# Patient Record
Sex: Male | Born: 1985 | Race: White | Hispanic: No | Marital: Married | Smoking: Current every day smoker
Health system: Southern US, Community
[De-identification: ages and names within clinical notes are randomized; demographics above are authoritative.]

---
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
## 2008-01-29 ENCOUNTER — Emergency Department (HOSPITAL_COMMUNITY): Admission: EM | Admit: 2008-01-29 | Discharge: 2008-01-30 | Payer: Self-pay | Admitting: Emergency Medicine

## 2010-07-14 ENCOUNTER — Emergency Department: Payer: Self-pay | Admitting: Emergency Medicine

## 2011-01-08 ENCOUNTER — Emergency Department: Payer: Self-pay | Admitting: Emergency Medicine

## 2011-01-23 LAB — POCT I-STAT, CHEM 8
BUN: 15
Calcium, Ion: 0.96 — ABNORMAL LOW
Chloride: 100
Creatinine, Ser: 1.3
Glucose, Bld: 85
HCT: 52
Hemoglobin: 17.7 — ABNORMAL HIGH
Potassium: 4.2
Sodium: 132 — ABNORMAL LOW
TCO2: 22

## 2011-11-19 ENCOUNTER — Emergency Department: Payer: Self-pay | Admitting: Emergency Medicine

## 2011-11-19 LAB — COMPREHENSIVE METABOLIC PANEL
Albumin: 4.5 g/dL (ref 3.4–5.0)
Alkaline Phosphatase: 83 U/L (ref 50–136)
Anion Gap: 7 (ref 7–16)
BUN: 15 mg/dL (ref 7–18)
Chloride: 104 mmol/L (ref 98–107)
Co2: 28 mmol/L (ref 21–32)
Creatinine: 1.05 mg/dL (ref 0.60–1.30)
Osmolality: 278 (ref 275–301)
SGPT (ALT): 33 U/L
Sodium: 139 mmol/L (ref 136–145)
Total Protein: 8.2 g/dL (ref 6.4–8.2)

## 2011-11-19 LAB — URINALYSIS, COMPLETE
Blood: NEGATIVE
Glucose,UR: NEGATIVE mg/dL (ref 0–75)
Leukocyte Esterase: NEGATIVE
Nitrite: NEGATIVE
Ph: 6 (ref 4.5–8.0)
Protein: NEGATIVE
RBC,UR: <1 /HPF (ref 0–5)
Squamous Epithelial: NONE SEEN

## 2011-11-19 LAB — CBC
HCT: 46.7 % (ref 40.0–52.0)
HGB: 16.3 g/dL (ref 13.0–18.0)
MCH: 32.2 pg (ref 26.0–34.0)
MCHC: 35 g/dL (ref 32.0–36.0)
MCV: 92 fL (ref 80–100)
RBC: 5.08 10*6/uL (ref 4.40–5.90)
RDW: 13 % (ref 11.5–14.5)

## 2011-11-23 ENCOUNTER — Emergency Department: Payer: Self-pay | Admitting: Emergency Medicine

## 2012-09-11 ENCOUNTER — Emergency Department: Payer: Self-pay | Admitting: Emergency Medicine

## 2012-09-11 LAB — URINALYSIS, COMPLETE
Bacteria: NONE SEEN
Glucose,UR: NEGATIVE mg/dL (ref 0–75)
Ketone: NEGATIVE
Nitrite: NEGATIVE
Ph: 6 (ref 4.5–8.0)
Specific Gravity: 1.023 (ref 1.003–1.030)
Squamous Epithelial: NONE SEEN
WBC UR: 37 /HPF (ref 0–5)

## 2012-09-13 LAB — URINE CULTURE

## 2013-03-04 ENCOUNTER — Encounter (HOSPITAL_COMMUNITY): Payer: Self-pay | Admitting: Emergency Medicine

## 2013-03-04 ENCOUNTER — Emergency Department (HOSPITAL_COMMUNITY)
Admission: EM | Admit: 2013-03-04 | Discharge: 2013-03-04 | Disposition: A | Discharge status: Home / Self Care | Payer: Self-pay | Attending: Emergency Medicine | Admitting: Emergency Medicine

## 2013-03-04 DIAGNOSIS — F17200 Nicotine dependence, unspecified, uncomplicated: Secondary | ICD-10-CM | POA: Insufficient documentation

## 2013-03-04 DIAGNOSIS — K0889 Other specified disorders of teeth and supporting structures: Secondary | ICD-10-CM

## 2013-03-04 DIAGNOSIS — K029 Dental caries, unspecified: Secondary | ICD-10-CM | POA: Insufficient documentation

## 2013-03-04 DIAGNOSIS — K089 Disorder of teeth and supporting structures, unspecified: Secondary | ICD-10-CM | POA: Insufficient documentation

## 2013-03-04 MED ORDER — OXYCODONE-ACETAMINOPHEN 5-325 MG PO TABS
2.0000 | ORAL_TABLET | Freq: Once | ORAL | Status: AC
  Administered 2013-03-04: 2 via ORAL
  Filled 2013-03-04: qty 2

## 2013-03-04 MED ORDER — OXYCODONE-ACETAMINOPHEN 5-325 MG PO TABS: 1.0000 | ORAL_TABLET | Freq: Four times a day (QID) | ORAL | Status: DC | PRN

## 2013-03-04 MED ORDER — PENICILLIN V POTASSIUM 500 MG PO TABS: 500.0000 mg | ORAL_TABLET | Freq: Four times a day (QID) | ORAL | Status: DC

## 2013-03-04 NOTE — ED Notes (Signed)
This is a 27 yo comes in with dental mouth pain for the last 2 days took 100mg  po tramadol 6hrs ago withtout relief
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
## 2013-03-04 NOTE — ED Provider Notes (Signed)
CSN: [id number]     Arrival date & time 03/04/13  1478 History   First MD Initiated Contact with Patient 03/04/13 [phone]     Chief Complaint  Patient presents with  . Dental Pain   (Consider location/radiation/quality/duration/timing/severity/associated sxs/prior Treatment) Patient is a 27 y.o. male presenting with tooth pain. The history is provided by the patient. No language interpreter was used.  Dental Pain Location:  Upper and lower Upper teeth location:  2/RU 2nd molar and 3/RU 1st molar Lower teeth location:  30/RL 1st molar and 31/RL 2nd molar Quality:  Sharp and throbbing Severity:  Moderate Onset quality:  Gradual Duration:  2 days Timing:  Constant Progression:  Worsening Chronicity:  Recurrent Context: dental caries and poor dentition   Context: not abscess, cap still on and not trauma   Relieved by: Tramadol. Worsened by:  Touching and pressure Associated symptoms: no difficulty swallowing, no drooling, no fever, no gum swelling, no neck pain, no neck swelling, no oral bleeding, no oral lesions and no trismus   Risk factors: lack of dental care and smoking     History reviewed. No pertinent past medical history. History reviewed. No pertinent past surgical history. No family history on file. History  Substance Use Topics  . Smoking status: Current Every Day Smoker  . Smokeless tobacco: Not on file  . Alcohol Use: Yes    Review of Systems  Constitutional: Negative for fever.  HENT: Positive for dental problem. Negative for drooling, mouth sores and trouble swallowing.   Respiratory: Negative for shortness of breath.   Gastrointestinal: Negative for nausea and vomiting.  Musculoskeletal: Negative for neck pain and neck stiffness.  All other systems reviewed and are negative.    Allergies  Review of patient's allergies indicates no known allergies.  Home Medications   Current Outpatient Rx  Name  Route  Sig  Dispense  Refill  . traMADol (ULTRAM) 50 MG  tablet   Oral   Take 50 mg by mouth every 6 (six) hours as needed.         Marland Kitchen oxyCODONE-acetaminophen (PERCOCET/ROXICET) 5-325 MG per tablet   Oral   Take 1 tablet by mouth every 6 (six) hours as needed for severe pain.   5 tablet   0   . penicillin v potassium (VEETID) 500 MG tablet   Oral   Take 1 tablet (500 mg total) by mouth 4 (four) times daily.   40 tablet   0    BP 126/73  Pulse 72  Temp(Src) 99.7 F (37.6 C) (Oral)  Resp 18  Ht 5\' 8"  (1.727 m)  Wt 185 lb (83.915 kg)  BMI 28.14 kg/m2  SpO2 100%  Physical Exam  Nursing note and vitals reviewed. Constitutional: He is oriented to person, place, and time. He appears well-developed and well-nourished. No distress.  HENT:  Head: Normocephalic and atraumatic.  Right Ear: Tympanic membrane, external ear and ear canal normal. No mastoid tenderness.  Left Ear: Tympanic membrane, external ear and ear canal normal. No mastoid tenderness.  Mouth/Throat: Uvula is midline, oropharynx is clear and moist and mucous membranes are normal. No trismus in the jaw. Abnormal dentition. Dental caries present. No dental abscesses or uvula swelling. No oropharyngeal exudate or tonsillar abscesses.    No area of fluctuance to suspect drainable dental abscess. Uvula midline and airway patent. Patient tolerating secretions without difficulty. No trismus or stridor. No facial swelling.  Eyes: Conjunctivae and EOM are normal. Pupils are equal, round, and reactive to light.  No scleral icterus.  Neck: Normal range of motion. Neck supple.  Pulmonary/Chest: Effort normal. No stridor. No respiratory distress.  Musculoskeletal: Normal range of motion.  Neurological: He is alert and oriented to person, place, and time.  Skin: Skin is warm and dry. No rash noted. He is not diaphoretic. No erythema. No pallor.  Psychiatric: He has a normal mood and affect. His behavior is normal.    ED Course  Procedures (including critical care time) Labs  Review Labs Reviewed - No data to display Imaging Review No results found.  EKG Interpretation   None       MDM   1. Pain, dental    Uncomplicated dental pain. Patient well and nontoxic appearing, hemodynamically stable, and afebrile. Patient tolerating secretions without difficulty. No trismus or stridor noted. Airway patent and uvula midline. No facial swelling, red flags, or signs concerning for Ludwig's angina. Patient treated in the ED with Percocet. He is stable for discharge with dental followup. Prescription for Penicillin and Percocet prescribed for symptoms. Return precautions discussed and patient agreeable to plan with no unaddressed concerns.    Antony Madura, [place] 03/04/13 [phone]

## 2013-03-11 NOTE — ED Provider Notes (Signed)
Medical screening examination/treatment/procedure(s) were performed by non-physician practitioner and as supervising physician I was immediately available for consultation/collaboration.  EKG Interpretation   None        Shaniya Tashiro, MD 03/11/13 0757 

## 2013-03-17 ENCOUNTER — Ambulatory Visit: Payer: Self-pay

## 2013-03-17 ENCOUNTER — Ambulatory Visit: Payer: Self-pay | Admitting: Internal Medicine

## 2013-07-12 ENCOUNTER — Emergency Department: Payer: Self-pay | Admitting: Internal Medicine

## 2014-02-26 ENCOUNTER — Emergency Department (HOSPITAL_COMMUNITY)
Admission: EM | Admit: 2014-02-26 | Discharge: 2014-02-26 | Disposition: A | Discharge status: Home / Self Care | Payer: Self-pay | Attending: Emergency Medicine | Admitting: Emergency Medicine

## 2014-02-26 ENCOUNTER — Encounter (HOSPITAL_COMMUNITY): Payer: Self-pay | Admitting: Emergency Medicine

## 2014-02-26 DIAGNOSIS — Z792 Long term (current) use of antibiotics: Secondary | ICD-10-CM | POA: Insufficient documentation

## 2014-02-26 DIAGNOSIS — Z4802 Encounter for removal of sutures: Secondary | ICD-10-CM | POA: Insufficient documentation

## 2014-02-26 DIAGNOSIS — Z720 Tobacco use: Secondary | ICD-10-CM | POA: Insufficient documentation
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
## 2014-02-26 NOTE — Discharge Instructions (Signed)

## 2014-02-26 NOTE — ED Notes (Signed)
Pt here for staple removal. Staples in place on top of head, no signs of redness or infection. Pt denies any issues with staples.

## 2014-06-03 ENCOUNTER — Encounter (HOSPITAL_COMMUNITY): Payer: Self-pay | Admitting: Emergency Medicine

## 2014-06-03 ENCOUNTER — Emergency Department (HOSPITAL_COMMUNITY)
Admission: EM | Admit: 2014-06-03 | Discharge: 2014-06-03 | Disposition: A | Discharge status: Home / Self Care | Payer: Self-pay | Attending: Emergency Medicine | Admitting: Emergency Medicine

## 2014-06-03 ENCOUNTER — Emergency Department (HOSPITAL_COMMUNITY): Payer: Self-pay

## 2014-06-03 DIAGNOSIS — Z79899 Other long term (current) drug therapy: Secondary | ICD-10-CM | POA: Insufficient documentation

## 2014-06-03 DIAGNOSIS — Z720 Tobacco use: Secondary | ICD-10-CM | POA: Insufficient documentation

## 2014-06-03 DIAGNOSIS — R05 Cough: Secondary | ICD-10-CM | POA: Insufficient documentation

## 2014-06-03 DIAGNOSIS — R059 Cough, unspecified: Secondary | ICD-10-CM

## 2014-06-03 DIAGNOSIS — R0981 Nasal congestion: Secondary | ICD-10-CM

## 2014-06-03 DIAGNOSIS — J3489 Other specified disorders of nose and nasal sinuses: Secondary | ICD-10-CM | POA: Insufficient documentation

## 2014-06-03 MED ORDER — BENZONATATE 100 MG PO CAPS: 100.0000 mg | ORAL_CAPSULE | Freq: Three times a day (TID) | ORAL | Status: DC

## 2014-06-03 MED ORDER — OXYMETAZOLINE HCL 0.05 % NA SOLN: 1.0000 | Freq: Two times a day (BID) | NASAL | Status: AC

## 2014-06-03 MED ORDER — MOMETASONE FUROATE 50 MCG/ACT NA SUSP: 2.0000 | Freq: Every day | NASAL | Status: DC

## 2014-06-03 NOTE — ED Notes (Signed)
Pt states he has been feeling bad for about 3 days   Pt states he has nasal congestion and sinus pressure  Pt c/o nonproductive cough

## 2014-06-03 NOTE — ED Notes (Signed)
Pt reports nasal congestion x 3 days. 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 NAD noted.

## 2014-06-03 NOTE — Discharge Instructions (Signed)

## 2014-06-03 NOTE — ED Provider Notes (Signed)
CSN: [id number]     Arrival date & time 06/03/14  0044 History   First MD Initiated Contact with Patient 06/03/14 0111     Chief Complaint  Patient presents with  . Nasal Congestion     (Consider location/radiation/quality/duration/timing/severity/associated sxs/prior Treatment) HPI Patient presents with 3 days of nasal congestion and sinus pressure. He also complains of nonproductive cough. Denies any fever or chills. No sore throat. Denies chest pain or difficulty breathing. Patient states he's tried over-the-counter medication without relief. Symptoms are exacerbated by cold weather. History reviewed. No pertinent past medical history. History reviewed. No pertinent past surgical history. History reviewed. No pertinent family history. History  Substance Use Topics  . Smoking status: Current Every Day Smoker  . Smokeless tobacco: Not on file  . Alcohol Use: Yes    Review of Systems  Constitutional: Negative for fever and chills.  HENT: Positive for congestion and sinus pressure. Negative for facial swelling, rhinorrhea and sore throat.   Respiratory: Positive for cough. Negative for shortness of breath and wheezing.   Gastrointestinal: Negative for nausea, vomiting and abdominal pain.  Musculoskeletal: Negative for myalgias, arthralgias and neck stiffness.  Skin: Negative for rash.  Neurological: Negative for dizziness, weakness, numbness and headaches.  All other systems reviewed and are negative.     Allergies  Review of patient's allergies indicates no known allergies.  Home Medications   Prior to Admission medications   Medication Sig Start Date End Date Taking? Authorizing Provider  Pseudoeph-Doxylamine-DM-APAP (NYQUIL PO) Take 1 capsule by mouth at bedtime.   Yes Historical Provider, MD  Pseudoephedrine-APAP-DM (DAYQUIL PO) Take 2 capsules by mouth 2 (two) times daily.   Yes Historical Provider, MD  benzonatate (TESSALON) 100 MG capsule Take 1 capsule (100 mg  total) by mouth every 8 (eight) hours. 06/03/14   Loren Raceravid Charnel Giles, MD  mometasone (NASONEX) 50 MCG/ACT nasal spray Place 2 sprays into the nose daily. 06/03/14   Loren Raceravid Valicia Rief, MD  oxyCODONE-acetaminophen (PERCOCET/ROXICET) 5-325 MG per tablet Take 1 tablet by mouth every 6 (six) hours as needed for severe pain. Patient not taking: Reported on 06/03/2014 03/04/13   Antony MaduraKelly Humes, PA-C  oxymetazoline (AFRIN NASAL SPRAY) 0.05 % nasal spray Place 1 spray into both nostrils 2 (two) times daily. 06/03/14   Loren Raceravid Lucianna Ostlund, MD  penicillin v potassium (VEETID) 500 MG tablet Take 1 tablet (500 mg total) by mouth 4 (four) times daily. Patient not taking: Reported on 06/03/2014 03/04/13   Antony MaduraKelly Humes, PA-C   BP 147/58 mmHg  Pulse 77  Temp(Src) 97.7 F (36.5 C)  Resp 18  SpO2 96% Physical Exam  Constitutional: He is oriented to person, place, and time. He appears well-developed and well-nourished. No distress.  HENT:  Head: Normocephalic and atraumatic.  Mouth/Throat: Oropharynx is clear and moist. No oropharyngeal exudate.  Bilateral nasal mucosal edema.  Eyes: EOM are normal. Pupils are equal, round, and reactive to light.  Neck: Normal range of motion. Neck supple.  Cardiovascular: Normal rate and regular rhythm.   Pulmonary/Chest: Effort normal and breath sounds normal. No respiratory distress. He has no wheezes. He has no rales. He exhibits no tenderness.  Abdominal: Soft. Bowel sounds are normal. He exhibits no distension and no mass. There is no tenderness. There is no rebound and no guarding.  Musculoskeletal: Normal range of motion. He exhibits no edema or tenderness.  Neurological: He is alert and oriented to person, place, and time.  Skin: Skin is warm and dry. No rash noted. No erythema.  Psychiatric: He has a normal mood and affect. His behavior is normal.  Nursing note and vitals reviewed.   ED Course  Procedures (including critical care time) Labs Review Labs Reviewed - No data to  display  Imaging Review Dg Chest 2 View  06/03/2014   CLINICAL DATA:  Acute onset of nasal congestion and sinus pressure. Initial encounter.  EXAM: CHEST  2 VIEW  COMPARISON:  None.  FINDINGS: There is no evidence of fracture or dislocation. The joint spaces are preserved. The carpal rows are intact, and demonstrate normal alignment.  The soft tissues are unremarkable in appearance.  IMPRESSION: No acute cardiopulmonary process seen.   Electronically Signed   By: Roanna Raider M.D.   On: 06/03/2014 01:58     EKG Interpretation None      MDM   Final diagnoses:  Cough  Sinus congestion   Chest x-ray without any evidence of pneumonia. We'll treat symptomatically. Likely URI. Return precautions given.    Loren Racer, MD 06/03/14 [phone]

## 2014-09-10 ENCOUNTER — Encounter (HOSPITAL_COMMUNITY): Payer: Self-pay

## 2014-09-10 ENCOUNTER — Emergency Department (HOSPITAL_COMMUNITY)
Admission: EM | Admit: 2014-09-10 | Discharge: 2014-09-10 | Disposition: A | Discharge status: Home / Self Care | Payer: Self-pay | Attending: Emergency Medicine | Admitting: Emergency Medicine

## 2014-09-10 DIAGNOSIS — Y9389 Activity, other specified: Secondary | ICD-10-CM | POA: Insufficient documentation

## 2014-09-10 DIAGNOSIS — X58XXXA Exposure to other specified factors, initial encounter: Secondary | ICD-10-CM | POA: Insufficient documentation

## 2014-09-10 DIAGNOSIS — S30811A Abrasion of abdominal wall, initial encounter: Secondary | ICD-10-CM | POA: Insufficient documentation

## 2014-09-10 DIAGNOSIS — Y998 Other external cause status: Secondary | ICD-10-CM | POA: Insufficient documentation

## 2014-09-10 DIAGNOSIS — Z720 Tobacco use: Secondary | ICD-10-CM | POA: Insufficient documentation

## 2014-09-10 DIAGNOSIS — Y9289 Other specified places as the place of occurrence of the external cause: Secondary | ICD-10-CM | POA: Insufficient documentation

## 2014-09-10 NOTE — ED Provider Notes (Signed)
CSN: [id number]     Arrival date & time 09/10/14  1815 History  This chart was scribed for non-physician practitioner working, Elson AreasLeslie K Gracen Ringwald, PA-C, with Lorre NickAnthony Allen, MD, by Modena JanskyAlbert Thayil, ED Scribe. This patient was seen in room WTR7/WTR7 and the patient's care was started at 7:24 PM.  Chief Complaint  Patient presents with  . Abscess   The history is provided by the patient. No language interpreter was used.   HPI Comments: [name] is a 29 y.o. male who presents to the Emergency Department complaining of a constant moderate rash that started 4 days ago. He states that he noticed a worsening rash to his abdominal area possibly from a new belt buckle. He reports that his tetanus status is UTD.  History reviewed. No pertinent past medical history. History reviewed. No pertinent past surgical history. History reviewed. No pertinent family history. History  Substance Use Topics  . Smoking status: Current Every Day Smoker  . Smokeless tobacco: Not on file  . Alcohol Use: Yes    Review of Systems  Skin: Positive for rash.  All other systems reviewed and are negative.   Allergies  Review of patient's allergies indicates no known allergies.  Home Medications   Prior to Admission medications   Medication Sig Start Date End Date Taking? Authorizing Provider  benzonatate (TESSALON) 100 MG capsule Take 1 capsule (100 mg total) by mouth every 8 (eight) hours. Patient not taking: Reported on 09/10/2014 06/03/14   Loren Raceravid Yelverton, MD  mometasone (NASONEX) 50 MCG/ACT nasal spray Place 2 sprays into the nose daily. Patient not taking: Reported on 09/10/2014 06/03/14   Loren Raceravid Yelverton, MD  oxyCODONE-acetaminophen (PERCOCET/ROXICET) 5-325 MG per tablet Take 1 tablet by mouth every 6 (six) hours as needed for severe pain. Patient not taking: Reported on 06/03/2014 03/04/13   Antony MaduraKelly Humes, PA-C  oxymetazoline (AFRIN NASAL SPRAY) 0.05 % nasal spray Place 1 spray into both nostrils 2 (two)  times daily. Patient not taking: Reported on 09/10/2014 06/03/14   Loren Raceravid Yelverton, MD  penicillin v potassium (VEETID) 500 MG tablet Take 1 tablet (500 mg total) by mouth 4 (four) times daily. Patient not taking: Reported on 06/03/2014 03/04/13   Antony MaduraKelly Humes, PA-C   BP 143/70 mmHg  Pulse 87  Temp(Src) 98.1 F (36.7 C) (Oral)  Resp 20  SpO2 99% Physical Exam  Constitutional: He is oriented to person, place, and time. He appears well-developed and well-nourished. No distress.  HENT:  Head: Normocephalic and atraumatic.  Neck: Neck supple. No tracheal deviation present.  Cardiovascular: Normal rate.   Pulmonary/Chest: Effort normal. No respiratory distress.  Musculoskeletal: Normal range of motion.  Neurological: He is alert and oriented to person, place, and time.  Skin: Skin is warm and dry.  Superficial dried abrasions, approximately five 2mm areas.   Psychiatric: He has a normal mood and affect. His behavior is normal.  Nursing note and vitals reviewed.   ED Course  Procedures (including critical care time) DIAGNOSTIC STUDIES: Oxygen Saturation is 99% on RA, normal by my interpretation.    COORDINATION OF CARE: 7:28 PM- Pt advised of plan for treatment and pt agrees.  Labs Review Labs Reviewed - No data to display  Imaging Review No results found.   EKG Interpretation None      MDM   Final diagnoses:  Abrasion of abdominal wall, initial encounter     I personally performed the services in this documentation, which was scribed in my presence.  The recorded information  has been reviewed and considered.   Barnet PallKaren SofiaPAC.    Elson AreasLeslie K Bassem Bernasconi, PA-C 09/17/14 0730  Elson AreasLeslie K Bettymae Yott, PA-C 09/17/14 0731  Elson AreasLeslie K Korynne Dols, PA-C 09/17/14 [id number]  Lorre NickAnthony Allen, MD 09/18/14 [phone]

## 2014-09-10 NOTE — Discharge Instructions (Signed)

## 2014-09-10 NOTE — ED Provider Notes (Deleted)
CSN: [id number]     Arrival date & time 09/10/14  1815 History   First MD Initiated Contact with Patient 09/10/14 1854     Chief Complaint  Patient presents with  . Abscess     (Consider location/radiation/quality/duration/timing/severity/associated sxs/prior Treatment) HPI  History reviewed. No pertinent past medical history. History reviewed. No pertinent past surgical history. History reviewed. No pertinent family history. History  Substance Use Topics  . Smoking status: Current Every Day Smoker  . Smokeless tobacco: Not on file  . Alcohol Use: Yes    Review of Systems    Allergies  Review of patient's allergies indicates no known allergies.  Home Medications   Prior to Admission medications   Medication Sig Start Date End Date Taking? Authorizing Provider  benzonatate (TESSALON) 100 MG capsule Take 1 capsule (100 mg total) by mouth every 8 (eight) hours. Patient not taking: Reported on 09/10/2014 06/03/14   Loren Raceravid Yelverton, MD  mometasone (NASONEX) 50 MCG/ACT nasal spray Place 2 sprays into the nose daily. Patient not taking: Reported on 09/10/2014 06/03/14   Loren Raceravid Yelverton, MD  oxyCODONE-acetaminophen (PERCOCET/ROXICET) 5-325 MG per tablet Take 1 tablet by mouth every 6 (six) hours as needed for severe pain. Patient not taking: Reported on 06/03/2014 03/04/13   Antony MaduraKelly Humes, PA-C  oxymetazoline (AFRIN NASAL SPRAY) 0.05 % nasal spray Place 1 spray into both nostrils 2 (two) times daily. Patient not taking: Reported on 09/10/2014 06/03/14   Loren Raceravid Yelverton, MD  penicillin v potassium (VEETID) 500 MG tablet Take 1 tablet (500 mg total) by mouth 4 (four) times daily. Patient not taking: Reported on 06/03/2014 03/04/13   Antony MaduraKelly Humes, PA-C   BP 143/70 mmHg  Pulse 87  Temp(Src) 98.1 F (36.7 C) (Oral)  Resp 20  SpO2 99% Physical Exam  ED Course  Procedures (including critical care time) Labs Review Labs Reviewed - No data to display  Imaging Review No results  found.   EKG Interpretation None      MDM   Final diagnoses:  Abrasion of abdominal wall, initial encounter    Antibiotic ointment Return if any problems.   I personally performed the services in this documentation, which was scribed in my presence.  The recorded information has been reviewed and considered.   Barnet PallKaren SofiaPAC.  Lonia SkinnerLeslie K Lake AngelusSofia, PA-C 09/10/14 1954  Lorre NickAnthony Allen, MD 09/10/14 [phone]

## 2014-09-10 NOTE — ED Notes (Signed)
Pt with abscess/blister area at abdomen from new belt buckle.  Noticed site on Monday.  Getting worse

## 2014-11-14 ENCOUNTER — Emergency Department (HOSPITAL_COMMUNITY)
Admission: EM | Admit: 2014-11-14 | Discharge: 2014-11-14 | Disposition: A | Discharge status: Home / Self Care | Payer: Self-pay | Attending: Emergency Medicine | Admitting: Emergency Medicine

## 2014-11-14 ENCOUNTER — Encounter (HOSPITAL_COMMUNITY): Payer: Self-pay | Admitting: Emergency Medicine

## 2014-11-14 ENCOUNTER — Emergency Department (HOSPITAL_COMMUNITY): Payer: Self-pay

## 2014-11-14 DIAGNOSIS — R079 Chest pain, unspecified: Secondary | ICD-10-CM

## 2014-11-14 DIAGNOSIS — R21 Rash and other nonspecific skin eruption: Secondary | ICD-10-CM | POA: Insufficient documentation

## 2014-11-14 DIAGNOSIS — Z720 Tobacco use: Secondary | ICD-10-CM | POA: Insufficient documentation

## 2014-11-14 MED ORDER — IBUPROFEN 800 MG PO TABS: 800.0000 mg | ORAL_TABLET | Freq: Three times a day (TID) | ORAL | Status: DC | PRN

## 2014-11-14 MED ORDER — ACYCLOVIR 400 MG PO TABS: 800.0000 mg | ORAL_TABLET | Freq: Every day | ORAL | Status: DC

## 2014-11-14 NOTE — ED Notes (Signed)
Pt sts right sided rib pain upon waking this am; pt denies obvious injury

## 2014-11-14 NOTE — Discharge Instructions (Signed)
Shingles Call the [facility] to get a primary care physician and to be seen if not improved in a week. Shingles is caused by the same virus that causes chickenpox. The first feelings may be pain or tingling. A rash will follow in a couple days. The rash may occur on any area of the body. Long-lasting pain is more likely in an elderly person. It can last months to years. There are medicines that can help prevent pain if you start taking them early. HOME CARE   Take cool baths or place cool cloths on the rash as told by your doctor.  Take medicine only as told by your doctor.  Rest as told by your doctor.  Keep your rash clean with mild soap and cool water or as told by your doctor.  Do not scratch your rash. You may use calamine lotion to relieve itchy skin as told by your doctor.  Keep your rash covered with a loose bandage (dressing).  Avoid touching:  Babies.  Pregnant women.  Children with inflamed skin (eczema).  People who have gotten organ transplants.  People with chronic illnesses, such as leukemia or AIDS.  Wear loose-fitting clothing.  If the rash is on the face, you may need to see a specialist. Keep all appointments. Shingles must be kept away from the eyes, if possible.  Keep all follow-up visits as told by your doctor. GET HELP RIGHT AWAY IF:   You have any pain on the face or eye.  You lose feeling on one side of your face.  You have ear pain or ringing in your ear.  You cannot taste as well.  Your medicines do not help the pain.  Your redness or puffiness (swelling) spreads.  You feel like you are getting worse.  You have a fever. MAKE SURE YOU:   Understand these instructions.  Will watch your condition.  Will get help right away if you are not doing well or get worse. Document Released: 09/26/2007 Document Revised: 08/24/2013 Document Reviewed: 09/26/2007 [facility] Patient Information 2015 [place], [place]. This  information is not intended to replace advice given to you by your health care provider. Make sure you discuss any questions you have with your health care provider.
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
## 2016-01-03 ENCOUNTER — Emergency Department (HOSPITAL_COMMUNITY)
Admission: EM | Admit: 2016-01-03 | Discharge: 2016-01-03 | Disposition: A | Discharge status: Home / Self Care | Payer: Self-pay | Attending: Emergency Medicine | Admitting: Emergency Medicine

## 2016-01-03 ENCOUNTER — Encounter (HOSPITAL_COMMUNITY): Payer: Self-pay | Admitting: Emergency Medicine

## 2016-01-03 DIAGNOSIS — K529 Noninfective gastroenteritis and colitis, unspecified: Secondary | ICD-10-CM | POA: Insufficient documentation

## 2016-01-03 DIAGNOSIS — F1290 Cannabis use, unspecified, uncomplicated: Secondary | ICD-10-CM | POA: Insufficient documentation

## 2016-01-03 DIAGNOSIS — Z79899 Other long term (current) drug therapy: Secondary | ICD-10-CM | POA: Insufficient documentation

## 2016-01-03 DIAGNOSIS — F17200 Nicotine dependence, unspecified, uncomplicated: Secondary | ICD-10-CM | POA: Insufficient documentation

## 2016-01-03 MED ORDER — SODIUM CHLORIDE 0.9 % IV BOLUS (SEPSIS): 2000.0000 mL | Freq: Once | INTRAVENOUS | Status: DC

## 2016-01-03 MED ORDER — SODIUM CHLORIDE 0.9 % IV SOLN: INTRAVENOUS | Status: DC

## 2016-01-03 MED ORDER — ONDANSETRON HCL 4 MG/2ML IJ SOLN
4.0000 mg | Freq: Once | INTRAMUSCULAR | Status: DC
  Filled 2016-01-03: qty 2

## 2016-01-03 MED ORDER — ONDANSETRON 8 MG PO TBDP: 8.0000 mg | ORAL_TABLET | Freq: Three times a day (TID) | ORAL | 0 refills | Status: DC | PRN

## 2016-01-03 NOTE — ED Provider Notes (Signed)
[facility]-EMERGENCY DEPT Provider Note   CSN: [id number] Arrival date & time: 01/03/16  2231     History   Chief Complaint Chief Complaint  Patient presents with  . Emesis  . Abdominal Pain    HPI Paul Lutz is a 30 y.o. male.  31 year old male presents with 3 days of myalgias with nausea vomiting. Patient notes positive sick exposures. Denies any sore throat or ear pain. No fever or chills. Denies any diarrhea. Symptoms have gradually resolved. He is able to keep down liquids okay today. Symptoms seem to be better when he has rest. Denies any headache or insect bites. No photophobia.      History reviewed. No pertinent past medical history.  There are no active problems to display for this patient.   History reviewed. No pertinent surgical history.     Home Medications    Prior to Admission medications   Medication Sig Start Date End Date Taking? Authorizing Provider  HYDROcodone-acetaminophen (NORCO/VICODIN) 5-325 MG tablet Take 1 tablet by mouth once.   Yes Historical Provider, MD  oxymetazoline (AFRIN NASAL SPRAY) 0.05 % nasal spray Place 1 spray into both nostrils 2 (two) times daily. Patient not taking: Reported on 09/10/2014 06/03/14   Loren Racer, MD    Family History History reviewed. No pertinent family history.  Social History Social History  Substance Use Topics  . Smoking status: Current Every Day Smoker  . Smokeless tobacco: Never Used  . Alcohol use Yes     Allergies   Review of patient's allergies indicates no known allergies.   Review of Systems Review of Systems  All other systems reviewed and are negative.    Physical Exam Updated Vital Signs BP 129/71 (BP Location: Left Arm)   Pulse 71   Temp 98.2 F (36.8 C) (Oral)   Resp 18   Ht 5\' 8"  (1.727 m)   Wt 72.6 kg   SpO2 98%   BMI 24.33 kg/m   Physical Exam  Constitutional: He is oriented to person, place, and time. He appears well-developed and well-nourished.   Non-toxic appearance. No distress.  HENT:  Head: Normocephalic and atraumatic.  Eyes: Conjunctivae, EOM and lids are normal. Pupils are equal, round, and reactive to light.  Neck: Normal range of motion. Neck supple. No tracheal deviation present. No thyroid mass present.  Cardiovascular: Normal rate, regular rhythm and normal heart sounds.  Exam reveals no gallop.   No murmur heard. Pulmonary/Chest: Effort normal and breath sounds normal. No stridor. No respiratory distress. He has no decreased breath sounds. He has no wheezes. He has no rhonchi. He has no rales.  Abdominal: Soft. Normal appearance and bowel sounds are normal. He exhibits no distension. There is no tenderness. There is no rebound and no CVA tenderness.  Musculoskeletal: Normal range of motion. He exhibits no edema or tenderness.  Neurological: He is alert and oriented to person, place, and time. He has normal strength. No cranial nerve deficit or sensory deficit. GCS eye subscore is 4. GCS verbal subscore is 5. GCS motor subscore is 6.  Skin: Skin is warm and dry. No abrasion and no rash noted.  Psychiatric: He has a normal mood and affect. His speech is normal and behavior is normal.  Nursing note and vitals reviewed.    ED Treatments / Results  Labs (all labs ordered are listed, but only abnormal results are displayed) Labs Reviewed - No data to display  EKG  EKG Interpretation None  Radiology No results found.  Procedures Procedures (including critical care time)  Medications Ordered in ED Medications - No data to display   Initial Impression / Assessment and Plan / ED Course  I have reviewed the triage vital signs and the nursing notes.  Pertinent labs & imaging results that were available during my care of the patient were reviewed by me and considered in my medical decision making (see chart for details).  Clinical Course    Patient is well-hydrated at this time. Likely viral illness. Stable  for discharge  Final Clinical Impressions(s) / ED Diagnoses   Final diagnoses:  None    New Prescriptions New Prescriptions   No medications on file     Lorre NickAnthony Cheston Coury, MD 01/03/16 2324

## 2016-01-03 NOTE — ED Notes (Signed)
MD at bedside. 

## 2016-01-03 NOTE — ED Triage Notes (Signed)
Pt states he has not felt well for the past 3 days  Pt states he had vomiting last night and again today  Pt denies diarrhea or fever  Pt states he just feels weak and tired

## 2016-07-04 ENCOUNTER — Encounter (HOSPITAL_COMMUNITY): Payer: Self-pay

## 2016-07-04 ENCOUNTER — Emergency Department (HOSPITAL_COMMUNITY)
Admission: EM | Admit: 2016-07-04 | Discharge: 2016-07-04 | Disposition: A | Discharge status: Home / Self Care | Payer: Self-pay | Attending: Emergency Medicine | Admitting: Emergency Medicine

## 2016-07-04 DIAGNOSIS — F17200 Nicotine dependence, unspecified, uncomplicated: Secondary | ICD-10-CM | POA: Insufficient documentation

## 2016-07-04 DIAGNOSIS — K029 Dental caries, unspecified: Secondary | ICD-10-CM | POA: Insufficient documentation

## 2016-07-04 DIAGNOSIS — Z79899 Other long term (current) drug therapy: Secondary | ICD-10-CM | POA: Insufficient documentation

## 2016-07-04 MED ORDER — AMOXICILLIN 500 MG PO CAPS: 1000.0000 mg | ORAL_CAPSULE | Freq: Two times a day (BID) | ORAL | 0 refills | Status: DC

## 2016-07-04 MED ORDER — AMOXICILLIN 500 MG PO CAPS
1000.0000 mg | ORAL_CAPSULE | Freq: Once | ORAL | Status: AC
  Administered 2016-07-04: 1000 mg via ORAL
  Filled 2016-07-04: qty 2

## 2016-07-04 MED ORDER — IBUPROFEN 800 MG PO TABS
800.0000 mg | ORAL_TABLET | Freq: Once | ORAL | Status: AC
  Administered 2016-07-04: 800 mg via ORAL
  Filled 2016-07-04: qty 1

## 2016-07-04 MED ORDER — IBUPROFEN 800 MG PO TABS: 800.0000 mg | ORAL_TABLET | Freq: Three times a day (TID) | ORAL | 0 refills | Status: DC

## 2016-07-04 MED ORDER — TRAMADOL HCL 50 MG PO TABS
100.0000 mg | ORAL_TABLET | Freq: Once | ORAL | Status: AC
Start: 2016-07-04 — End: 2016-07-04
  Administered 2016-07-04: 100 mg via ORAL
  Filled 2016-07-04: qty 2

## 2016-07-04 NOTE — ED Triage Notes (Signed)
Pt c/o 10/10 left sided mouth pain that is radiating to the back of his head. Pt reports having teeth on the left side extracted in the past. Pt A+OX4, speaking in complete sentences, ambulatory to triage.

## 2016-07-04 NOTE — ED Notes (Signed)
Bed: [id number] Expected date:  Expected time:  Means of arrival:  Comments: TR

## 2016-07-04 NOTE — ED Provider Notes (Signed)
[facility]-EMERGENCY DEPT Provider Note   CSN: [id number] Arrival date & time: 07/04/16  0508     History   Chief Complaint Chief Complaint  Patient presents with  . Dental Pain    HPI Paul Lutz is a 31 y.o. male.  HPI Reports dental pain on and off for years. For 2 days now worsening pain in his jaws and face. Had been on the right side but now concentrated on the left side. Also jaw pain with range of motion. Intermittent posterior headache. No fevers. Occasional nasal discharge. No nausea vomiting. History reviewed. No pertinent past medical history.  There are no active problems to display for this patient.   History reviewed. No pertinent surgical history.     Home Medications    Prior to Admission medications   Medication Sig Start Date End Date Taking? Authorizing Provider  amoxicillin (AMOXIL) 500 MG capsule Take 2 capsules (1,000 mg total) by mouth 2 (two) times daily. 07/04/16   Arby Barrette, MD  HYDROcodone-acetaminophen (NORCO/VICODIN) 5-325 MG tablet Take 1 tablet by mouth once.    Historical Provider, MD  ibuprofen (ADVIL,MOTRIN) 800 MG tablet Take 1 tablet (800 mg total) by mouth 3 (three) times daily. 07/04/16   Arby Barrette, MD  ondansetron (ZOFRAN ODT) 8 MG disintegrating tablet Take 1 tablet (8 mg total) by mouth every 8 (eight) hours as needed for nausea or vomiting. 01/03/16   Lorre Nick, MD  oxymetazoline (AFRIN NASAL SPRAY) 0.05 % nasal spray Place 1 spray into both nostrils 2 (two) times daily. Patient not taking: Reported on 09/10/2014 06/03/14   Loren Racer, MD    Family History History reviewed. No pertinent family history.  Social History Social History  Substance Use Topics  . Smoking status: Current Every Day Smoker  . Smokeless tobacco: Never Used  . Alcohol use Yes     Allergies   Patient has no known allergies.   Review of Systems Review of Systems Constitutional: No fever no chills, malaise ENT: Small amount of  nasal discharge without earache or sore throat Story: No cough or chest pain GI: No nausea vomiting abdominal pain  Physical Exam Updated Vital Signs BP 146/84 (BP Location: Left Arm)   Pulse 60   Temp 97.8 F (36.6 C) (Oral)   Resp 16   SpO2 99%   Physical Exam  Constitutional: He is oriented to person, place, and time. He appears well-developed and well-nourished. No distress.  HENT:  Head: Normocephalic and atraumatic.  Mouth/Throat: Oropharynx is clear and moist and mucous membranes are normal. No trismus in the jaw. Dental caries present. No dental abscesses.    No facial swelling or asymmetry. Bilateral TMs normal. Normal range of motion of the jaw. Nares patent without drainage or bogginess. No facial percussion tenderness. Extensive dental decay. Particularly all molars upper right. Focal decay inferior left molar. No appreciable area of abscess or drainage around the tooth.  Eyes: Conjunctivae and EOM are normal. Pupils are equal, round, and reactive to light.  Neck: Normal range of motion. Neck supple. No thyromegaly present.  Cardiovascular: Normal rate, regular rhythm, normal heart sounds and intact distal pulses.   Pulmonary/Chest: Effort normal and breath sounds normal.  Musculoskeletal: Normal range of motion.  Lymphadenopathy:    He has no cervical adenopathy.  Neurological: He is alert and oriented to person, place, and time. No cranial nerve deficit. He exhibits normal muscle tone. Coordination normal.  Skin: Skin is warm and dry.  Psychiatric: He has a normal  mood and affect.     ED Treatments / Results  Labs (all labs ordered are listed, but only abnormal results are displayed) Labs Reviewed - No data to display  EKG  EKG Interpretation None       Radiology No results found.  Procedures Procedures (including critical care time)  Medications Ordered in ED Medications  amoxicillin (AMOXIL) capsule 1,000 mg (not administered)  ibuprofen  (ADVIL,MOTRIN) tablet 800 mg (not administered)  traMADol (ULTRAM) tablet 100 mg (not administered)     Initial Impression / Assessment and Plan / ED Course  I have reviewed the triage vital signs and the nursing notes.  Pertinent labs & imaging results that were available during my care of the patient were reviewed by me and considered in my medical decision making (see chart for details).      Final Clinical Impressions(s) / ED Diagnoses   Final diagnoses:  Pain due to dental caries   Oral exam shows areas of multiple dental caries. Patient is a chronic and ongoing dental decay. At this time, there is no facial swelling or visible intraoral abscess at a specific tooth. Patient's pain is on the left inferior. There is an active caries present. Will treat with amoxicillin and ibuprofen. One dose of tramadol given in the emergency department. Dental resources provided. New Prescriptions New Prescriptions   AMOXICILLIN (AMOXIL) 500 MG CAPSULE    Take 2 capsules (1,000 mg total) by mouth 2 (two) times daily.   IBUPROFEN (ADVIL,MOTRIN) 800 MG TABLET    Take 1 tablet (800 mg total) by mouth 3 (three) times daily.     Arby BarretteMarcy Shelma Eiben, MD 07/04/16 [phone]

## 2016-09-26 ENCOUNTER — Encounter (HOSPITAL_COMMUNITY): Payer: Self-pay | Admitting: Emergency Medicine

## 2016-09-26 ENCOUNTER — Emergency Department (HOSPITAL_COMMUNITY)
Admission: EM | Admit: 2016-09-26 | Discharge: 2016-09-26 | Disposition: A | Discharge status: Home / Self Care | Payer: Self-pay | Attending: Emergency Medicine | Admitting: Emergency Medicine

## 2016-09-26 DIAGNOSIS — B353 Tinea pedis: Secondary | ICD-10-CM | POA: Insufficient documentation

## 2016-09-26 DIAGNOSIS — F17210 Nicotine dependence, cigarettes, uncomplicated: Secondary | ICD-10-CM | POA: Insufficient documentation

## 2016-09-26 MED ORDER — NYSTATIN 100000 UNIT/GM EX POWD: Freq: Three times a day (TID) | CUTANEOUS | 1 refills | Status: AC

## 2016-09-26 MED ORDER — IBUPROFEN 600 MG PO TABS: 600.0000 mg | ORAL_TABLET | Freq: Four times a day (QID) | ORAL | 0 refills | Status: DC | PRN

## 2016-09-26 NOTE — ED Triage Notes (Signed)
Pt states he wears steel toe boots and work and yesterday when he took his boot off his 2nd toe was swollen and red  Pt states today his great toe is swollen  Pt states they ache and itch

## 2016-09-26 NOTE — ED Provider Notes (Signed)
[facility]-EMERGENCY DEPT Provider Note   CSN: [id number] Arrival date & time: 09/26/16  0030     History   Chief Complaint Chief Complaint  Patient presents with  . Toe Pain    HPI Paul Lutz is a 31 y.o. male.  31 year old male with no significant past medical history presents to the emergency department for evaluation of swelling and redness to his great and second toes. He states that he has recently been working for [employer]. He is required to wear steel toed boots for approximately 12 hours per day. He states that he began to notice the redness and swelling yesterday. Symptoms associated with an aching discomfort as well as itching. No remedies attempted prior to arrival. No history of similar symptoms. Patient denies fever.   The history is provided by the patient. No language interpreter was used.    History reviewed. No pertinent past medical history.  There are no active problems to display for this patient.   History reviewed. No pertinent surgical history.     Home Medications    Prior to Admission medications   Medication Sig Start Date End Date Taking? Authorizing Provider  ibuprofen (ADVIL,MOTRIN) 600 MG tablet Take 1 tablet (600 mg total) by mouth every 6 (six) hours as needed. 09/26/16   Antony Madura, PA-C  nystatin (MYCOSTATIN/NYSTOP) powder Apply topically 3 (three) times daily. 09/26/16   Antony Madura, PA-C  oxymetazoline (AFRIN NASAL SPRAY) 0.05 % nasal spray Place 1 spray into both nostrils 2 (two) times daily. Patient not taking: Reported on 09/10/2014 06/03/14   Loren Racer, MD    Family History Family History  Problem Relation Age of Onset  . Hypertension Father     Social History Social History  Substance Use Topics  . Smoking status: Current Every Day Smoker    Types: Cigarettes  . Smokeless tobacco: Never Used  . Alcohol use Yes     Allergies   Patient has no known allergies.   Review of Systems Review of  Systems Ten systems reviewed and are negative for acute change, except as noted in the HPI.    Physical Exam Updated Vital Signs BP 122/65 (BP Location: Left Arm)   Pulse 74   Temp 97.5 F (36.4 C) (Oral)   Resp 20   SpO2 94%   Physical Exam  Constitutional: He is oriented to person, place, and time. He appears well-developed and well-nourished. No distress.  Nontoxic and in NAD  HENT:  Head: Normocephalic and atraumatic.  Eyes: Conjunctivae and EOM are normal. No scleral icterus.  Neck: Normal range of motion.  Cardiovascular: Normal rate, regular rhythm and intact distal pulses.   Pulses:      Dorsalis pedis pulses are 2+ on the left side.       Posterior tibial pulses are 2+ on the left side.  Pulmonary/Chest: Effort normal. No respiratory distress.  Musculoskeletal: Normal range of motion.  Neurological: He is alert and oriented to person, place, and time. He exhibits normal muscle tone. Coordination normal.  Skin: Skin is warm and dry. Rash noted. He is not diaphoretic. No erythema. No pallor.  Planar, macular, moist and erythematous rash to great toe, lateral 2nd toe and webbed spaces. Mild skin cracking.  Psychiatric: He has a normal mood and affect. His behavior is normal.  Nursing note and vitals reviewed.    ED Treatments / Results  Labs (all labs ordered are listed, but only abnormal results are displayed) Labs Reviewed - No data  to display  EKG  EKG Interpretation None       Radiology No results found.  Procedures Procedures (including critical care time)  Medications Ordered in ED Medications - No data to display   Initial Impression / Assessment and Plan / ED Course  I have reviewed the triage vital signs and the nursing notes.  Pertinent labs & imaging results that were available during my care of the patient were reviewed by me and considered in my medical decision making (see chart for details).     Patient presenting for symptoms  consistent with tinea pedis. Plan for supportive management. Prescription for nystatin powder given. Return precautions discussed and provided. Patient discharged in stable condition with no unaddressed concerns.   Final Clinical Impressions(s) / ED Diagnoses   Final diagnoses:  Tinea pedis of left foot    New Prescriptions Discharge Medication List as of 09/26/2016  2:10 AM    START taking these medications   Details  nystatin (MYCOSTATIN/NYSTOP) powder Apply topically 3 (three) times daily., Starting Wed 09/26/2016, Print         Antony MaduraHumes, Itzamara Casas, PA-C 09/26/16 [id number]    Zadie RhineWickline, Donald, MD 09/26/16 [id number]

## 2016-11-19 ENCOUNTER — Emergency Department (HOSPITAL_COMMUNITY)
Admission: EM | Admit: 2016-11-19 | Discharge: 2016-11-19 | Disposition: A | Discharge status: Home / Self Care | Payer: Self-pay | Attending: Emergency Medicine | Admitting: Emergency Medicine

## 2016-11-19 ENCOUNTER — Encounter (HOSPITAL_COMMUNITY): Payer: Self-pay

## 2016-11-19 DIAGNOSIS — K029 Dental caries, unspecified: Secondary | ICD-10-CM | POA: Insufficient documentation

## 2016-11-19 DIAGNOSIS — Z79899 Other long term (current) drug therapy: Secondary | ICD-10-CM | POA: Insufficient documentation

## 2016-11-19 DIAGNOSIS — F17210 Nicotine dependence, cigarettes, uncomplicated: Secondary | ICD-10-CM | POA: Insufficient documentation

## 2016-11-19 MED ORDER — PENICILLIN V POTASSIUM 500 MG PO TABS: 500.0000 mg | ORAL_TABLET | Freq: Three times a day (TID) | ORAL | 0 refills | Status: DC

## 2016-11-19 MED ORDER — IBUPROFEN 600 MG PO TABS: 600.0000 mg | ORAL_TABLET | Freq: Four times a day (QID) | ORAL | 0 refills | Status: DC | PRN

## 2016-11-19 NOTE — ED Provider Notes (Signed)
[facility]-EMERGENCY DEPT Provider Note   CSN: [id number] Arrival date & time: 11/19/16  2206     History   Chief Complaint Chief Complaint  Patient presents with  . Dental Pain    HPI Paul Lutz is a 31 y.o. male.  HPI   31 year old male presenting complaining of dental pain. Patient states for the past several days he has had increasing pain to his left upper and lower teeth. Pain is described as a sharp throbbing sensation, persistent, worsening with chewing or with temperature change. Pain is moderate in severity, not improve despite taking ibuprofen or naproxen. Pain is keeping him up at night. He denies having fever, chills, headache, runny nose, sneezing, coughing, sore throat, neck pain. Denies any recent injury. He is currently waiting for a free clinic for further dental care.  History reviewed. No pertinent past medical history.  There are no active problems to display for this patient.   History reviewed. No pertinent surgical history.     Home Medications    Prior to Admission medications   Medication Sig Start Date End Date Taking? Authorizing Provider  ibuprofen (ADVIL,MOTRIN) 600 MG tablet Take 1 tablet (600 mg total) by mouth every 6 (six) hours as needed. 09/26/16   Antony MaduraHumes, Kelly, PA-C  nystatin (MYCOSTATIN/NYSTOP) powder Apply topically 3 (three) times daily. 09/26/16   Antony MaduraHumes, Kelly, PA-C  oxymetazoline (AFRIN NASAL SPRAY) 0.05 % nasal spray Place 1 spray into both nostrils 2 (two) times daily. Patient not taking: Reported on 09/10/2014 06/03/14   Loren RacerYelverton, David, MD    Family History Family History  Problem Relation Age of Onset  . Hypertension Father     Social History Social History  Substance Use Topics  . Smoking status: Current Every Day Smoker    Types: Cigarettes  . Smokeless tobacco: Never Used  . Alcohol use Yes     Allergies   Patient has no known allergies.   Review of Systems Review of Systems  Constitutional: Negative for  fever.  HENT: Positive for dental problem.   Neurological: Negative for headaches.     Physical Exam Updated Vital Signs BP 135/81 (BP Location: Right Arm)   Pulse (!) 57   Temp 98.1 F (36.7 C) (Oral)   Resp 18   Ht 5\' 8"  (1.727 m)   Wt 97.1 kg (214 lb)   SpO2 99%   BMI 32.54 kg/m   Physical Exam  Constitutional: He appears well-developed and well-nourished. No distress.  HENT:  Head: Atraumatic.  Right Ear: External ear normal.  Left Ear: External ear normal.  Nose: Nose normal.  Mouth/Throat: Oropharynx is clear and moist.  Mouth: Tenderness along tooth #14,15 and 16 without any significant dental decay or abscess. Tenderness to tooth #20 with dental decay but no abscess. No trismus. No facial swelling  Eyes: Conjunctivae are normal.  Neck: Normal range of motion. Neck supple.  No nuchal rigidity  Neurological: He is alert.  Skin: No rash noted.  Psychiatric: He has a normal mood and affect.  Nursing note and vitals reviewed.    ED Treatments / Results  Labs (all labs ordered are listed, but only abnormal results are displayed) Labs Reviewed - No data to display  EKG  EKG Interpretation None       Radiology No results found.  Procedures Procedures (including critical care time)  Medications Ordered in ED Medications - No data to display   Initial Impression / Assessment and Plan / ED Course  I have reviewed  the triage vital signs and the nursing notes.  Pertinent labs & imaging results that were available during my care of the patient were reviewed by me and considered in my medical decision making (see chart for details).     BP 135/81 (BP Location: Right Arm)   Pulse (!) 57   Temp 98.1 F (36.7 C) (Oral)   Resp 18   Ht 5\' 8"  (1.727 m)   Wt 97.1 kg (214 lb)   SpO2 99%   BMI 32.54 kg/m    Final Clinical Impressions(s) / ED Diagnoses   Final diagnoses:  Pain due to dental caries    New Prescriptions New Prescriptions    PENICILLIN V POTASSIUM (VEETID) 500 MG TABLET    Take 1 tablet (500 mg total) by mouth 3 (three) times daily.   Patient with dentalgia.  No abscess requiring immediate incision and drainage.  Exam not concerning for Ludwig's angina or pharyngeal abscess.  Will treat with PCN and NSAIDs. Pt instructed to follow-up with dentist.  Discussed return precautions. Pt safe for discharge.    Fayrene Helperran, Shaneequa Bahner, PA-C 11/19/16 [id number]    Mancel BaleWentz, Elliott, MD 11/19/16 [phone]

## 2016-11-19 NOTE — ED Triage Notes (Signed)
Pt c/o L sided top and bottom dental pain x1 week. He states that he has been taking naproxen at home without relief. A&Ox4. No SOB or airway issues.

## 2016-12-15 ENCOUNTER — Emergency Department (HOSPITAL_COMMUNITY): Payer: Self-pay

## 2016-12-15 ENCOUNTER — Encounter (HOSPITAL_COMMUNITY): Payer: Self-pay | Admitting: Emergency Medicine

## 2016-12-15 ENCOUNTER — Emergency Department (HOSPITAL_COMMUNITY)
Admission: EM | Admit: 2016-12-15 | Discharge: 2016-12-15 | Disposition: A | Discharge status: Home / Self Care | Payer: Self-pay | Attending: Emergency Medicine | Admitting: Emergency Medicine

## 2016-12-15 DIAGNOSIS — Z79899 Other long term (current) drug therapy: Secondary | ICD-10-CM | POA: Insufficient documentation

## 2016-12-15 DIAGNOSIS — W0110XA Fall on same level from slipping, tripping and stumbling with subsequent striking against unspecified object, initial encounter: Secondary | ICD-10-CM | POA: Insufficient documentation

## 2016-12-15 DIAGNOSIS — F17210 Nicotine dependence, cigarettes, uncomplicated: Secondary | ICD-10-CM | POA: Insufficient documentation

## 2016-12-15 DIAGNOSIS — Y929 Unspecified place or not applicable: Secondary | ICD-10-CM | POA: Insufficient documentation

## 2016-12-15 DIAGNOSIS — M25572 Pain in left ankle and joints of left foot: Secondary | ICD-10-CM

## 2016-12-15 DIAGNOSIS — Y9301 Activity, walking, marching and hiking: Secondary | ICD-10-CM | POA: Insufficient documentation

## 2016-12-15 DIAGNOSIS — S93402A Sprain of unspecified ligament of left ankle, initial encounter: Secondary | ICD-10-CM | POA: Insufficient documentation

## 2016-12-15 DIAGNOSIS — Y999 Unspecified external cause status: Secondary | ICD-10-CM | POA: Insufficient documentation

## 2016-12-15 MED ORDER — IBUPROFEN 200 MG PO TABS
600.0000 mg | ORAL_TABLET | Freq: Once | ORAL | Status: AC
  Administered 2016-12-15: 600 mg via ORAL
  Filled 2016-12-15: qty 3

## 2016-12-15 NOTE — ED Provider Notes (Signed)
[facility]-EMERGENCY DEPT Provider Note   CSN: [id number] Arrival date & time: 12/15/16  1336     History   Chief Complaint No chief complaint on file.   HPI Paul Lutz is a 31 y.o. male who presents for evaluation of one week of left ankle pain.  He reports that he was walking and "rolled" his left ankle on a stone.  He has pain to the lateral aspect of his left ankle with associated swelling and brusing.  He denies any numbness or tingling.  His SO reports concern for a white area on the top of his foot.    HPI  History reviewed. No pertinent past medical history.  There are no active problems to display for this patient.   History reviewed. No pertinent surgical history.     Home Medications    Prior to Admission medications   Medication Sig Start Date End Date Taking? Authorizing Provider  ibuprofen (ADVIL,MOTRIN) 600 MG tablet Take 1 tablet (600 mg total) by mouth every 6 (six) hours as needed for moderate pain. 11/19/16   Fayrene Helper, PA-C  nystatin (MYCOSTATIN/NYSTOP) powder Apply topically 3 (three) times daily. 09/26/16   Antony Madura, PA-C  oxymetazoline (AFRIN NASAL SPRAY) 0.05 % nasal spray Place 1 spray into both nostrils 2 (two) times daily. Patient not taking: Reported on 09/10/2014 06/03/14   Loren Racer, MD  penicillin v potassium (VEETID) 500 MG tablet Take 1 tablet (500 mg total) by mouth 3 (three) times daily. 11/19/16   Fayrene Helper, PA-C    Family History Family History  Problem Relation Age of Onset  . Hypertension Father     Social History Social History  Substance Use Topics  . Smoking status: Current Every Day Smoker    Types: Cigarettes  . Smokeless tobacco: Never Used  . Alcohol use Yes     Allergies   Patient has no known allergies.   Review of Systems Review of Systems  Constitutional: Negative for chills and fever.  Musculoskeletal: Positive for arthralgias and joint swelling.  Skin: Positive for color change. Negative for  pallor and wound.     Physical Exam Updated Vital Signs BP 138/61 (BP Location: Right Arm)   Pulse 67   Temp 98.2 F (36.8 C) (Oral)   Resp 18   Ht 5\' 10"  (1.778 m)   Wt 97.1 kg (214 lb)   SpO2 100%   BMI 30.71 kg/m   Physical Exam  Constitutional: He appears well-developed and well-nourished.  HENT:  Head: Normocephalic and atraumatic.  Musculoskeletal:  Left lateral ankle is obviously swollen with ecchymosis present diffusely to lateral foot and ankle.  Foot is warm, well perfused with intact sensation.  Motor function limited secondary to pain.  No TTP over proximal tibia or fibula.  Lower leg compartments palpated, soft, easily compressible.   Neurological: He is alert. No sensory deficit. He exhibits normal muscle tone.  Skin: Skin is warm and dry. He is not diaphoretic.  Area that was concerning for SO examined, appears consitent with swelling from ankle sprain, no obvious infection, drainage, fluctuance or warmth.   Psychiatric: He has a normal mood and affect. His behavior is normal.  Nursing note and vitals reviewed.    ED Treatments / Results  Labs (all labs ordered are listed, but only abnormal results are displayed) Labs Reviewed - No data to display  EKG  EKG Interpretation None       Radiology Dg Ankle Complete Left  Result Date: 12/15/2016 CLINICAL  DATA:  Twisted ankle 1 week ago. Persistent pain and swelling. EXAM: LEFT ANKLE COMPLETE - 3+ VIEW COMPARISON:  None. FINDINGS: There is no evidence of fracture, dislocation, or joint effusion. There is no evidence of arthropathy or other focal bone abnormality. Soft tissues are unremarkable. IMPRESSION: Negative. Electronically Signed   By: Kennith Center M.D.   On: 12/15/2016 15:38   Dg Foot Complete Left  Result Date: 12/15/2016 CLINICAL DATA:  Injury with persistent pain. EXAM: LEFT FOOT - COMPLETE 3+ VIEW COMPARISON:  None. FINDINGS: There is no evidence of fracture or dislocation. There is no evidence  of arthropathy or other focal bone abnormality. Soft tissues are unremarkable. IMPRESSION: Negative. Electronically Signed   By: Kennith Center M.D.   On: 12/15/2016 15:38    Procedures Procedures (including critical care time)  Medications Ordered in ED Medications  ibuprofen (ADVIL,MOTRIN) tablet 600 mg (600 mg Oral Given 12/15/16 1607)     Initial Impression / Assessment and Plan / ED Course  I have reviewed the triage vital signs and the nursing notes.  Pertinent labs & imaging results that were available during my care of the patient were reviewed by me and considered in my medical decision making (see chart for details).    Molli Knock Presents with left ankle pain after rolling his ankle one week ago consistent with an ankle sprain/strain.  The affected ankle has mild edema and is tender on the lateral aspect.  X-rays were obtained with out acute abnormality. The skin is intact to ankle/foot.  The foot is warm and well perfused with intact sensation.  Motor function is limited secondary to pain.  Patient given instructions for OTC pain medication, his pain was treated in the ED, he was given an ACE wrap, offered air cast but declined stating he has a brace at home to use.  Patient advised to follow up with PCP if symptoms persist for longer than one week.  Patient was given the option to ask questions, all of which were answered to the best of my ability.  Patient is agreeable for discharge.     Final Clinical Impressions(s) / ED Diagnoses   Final diagnoses:  Sprain of left ankle, unspecified ligament, initial encounter  Acute left ankle pain    New Prescriptions New Prescriptions   No medications on file     Norman Clay 12/15/16 1612    Benjiman Core, MD 12/16/16 0030

## 2016-12-15 NOTE — ED Notes (Signed)
Bed: WTR7 Expected date:  Expected time:  Means of arrival:  Comments: Using for triage to see if appropriate

## 2016-12-15 NOTE — ED Triage Notes (Signed)
Pt reports he was walking down some stairs after it rained and slipped and injured his left ankle/foot. Pt ambulatory with full ROM at this time.

## 2016-12-15 NOTE — Discharge Instructions (Signed)
Please take Ibuprofen (Advil, motrin) and Tylenol (acetaminophen) to relieve your pain.  You may take up to 600 MG (3 pills) of normal strength ibuprofen every 8 hours as needed.  In between doses of ibuprofen you make take tylenol, up to 1,000 mg (two extra strength pills).  Do not take more than 3,000 mg tylenol in a 24 hour period.  Please check all medication labels as many medications such as pain and cold medications may contain tylenol.  Do not drink alcohol while taking these medications.  Do not take other NSAID'S while taking ibuprofen (such as aleve or naproxen).  Please take ibuprofen with food to decrease stomach upset.  Please wear supportive shoes.  Flip flops and slides often do not provide enough support.

## 2017-03-11 ENCOUNTER — Encounter (HOSPITAL_COMMUNITY): Payer: Self-pay | Admitting: *Deleted

## 2017-03-11 ENCOUNTER — Emergency Department (HOSPITAL_COMMUNITY)
Admission: EM | Admit: 2017-03-11 | Discharge: 2017-03-12 | Disposition: A | Discharge status: Home / Self Care | Payer: Self-pay | Attending: Emergency Medicine | Admitting: Emergency Medicine

## 2017-03-11 DIAGNOSIS — K029 Dental caries, unspecified: Secondary | ICD-10-CM | POA: Insufficient documentation

## 2017-03-11 DIAGNOSIS — F17210 Nicotine dependence, cigarettes, uncomplicated: Secondary | ICD-10-CM | POA: Insufficient documentation

## 2017-03-11 MED ORDER — PENICILLIN V POTASSIUM 500 MG PO TABS: 500.0000 mg | ORAL_TABLET | Freq: Four times a day (QID) | ORAL | 0 refills | Status: AC

## 2017-03-11 MED ORDER — NAPROXEN 500 MG PO TABS: 500.0000 mg | ORAL_TABLET | Freq: Two times a day (BID) | ORAL | 0 refills | Status: AC

## 2017-03-11 MED ORDER — NAPROXEN 500 MG PO TABS
500.0000 mg | ORAL_TABLET | Freq: Once | ORAL | Status: AC
  Administered 2017-03-12: 500 mg via ORAL
  Filled 2017-03-11: qty 1

## 2017-03-11 MED ORDER — PENICILLIN V POTASSIUM 500 MG PO TABS
500.0000 mg | ORAL_TABLET | Freq: Once | ORAL | Status: AC
  Administered 2017-03-12: 500 mg via ORAL
  Filled 2017-03-11: qty 1

## 2017-03-11 NOTE — ED Provider Notes (Signed)
[facility]-EMERGENCY DEPT Provider Note   CSN: [id number] Arrival date & time: 03/11/17  2205     History   Chief Complaint Chief Complaint  Patient presents with  . Facial Swelling    HPI Paul Lutz is a 31 y.o. male.  HPI Patient presented to the emergency room for evaluation of facial swelling and toothache.  Patient states he said some pain on the lower left side for the last couple weeks.  In the last week or so he has noticed intermittent swelling.  Patient states it hurts to chew.  He denies any fevers no difficulty breathing.  No difficulty swallowing. History reviewed. No pertinent past medical history.  There are no active problems to display for this patient.   History reviewed. No pertinent surgical history.     Home Medications    Prior to Admission medications   Medication Sig Start Date End Date Taking? Authorizing Provider  naproxen (NAPROSYN) 500 MG tablet Take 1 tablet (500 mg total) 2 (two) times daily by mouth. 03/11/17   Linwood DibblesKnapp, Bellami Farrelly, MD  nystatin (MYCOSTATIN/NYSTOP) powder Apply topically 3 (three) times daily. 09/26/16   Antony MaduraHumes, Kelly, PA-C  oxymetazoline (AFRIN NASAL SPRAY) 0.05 % nasal spray Place 1 spray into both nostrils 2 (two) times daily. Patient not taking: Reported on 09/10/2014 06/03/14   Loren RacerYelverton, David, MD  penicillin v potassium (VEETID) 500 MG tablet Take 1 tablet (500 mg total) 4 (four) times daily for 7 days by mouth. 03/11/17 03/18/17  Linwood DibblesKnapp, Camryn Lampson, MD    Family History Family History  Problem Relation Age of Onset  . Hypertension Father     Social History Social History   Tobacco Use  . Smoking status: Current Every Day Smoker    Types: Cigarettes  . Smokeless tobacco: Never Used  Substance Use Topics  . Alcohol use: Yes  . Drug use: Yes    Types: Marijuana     Allergies   Patient has no known allergies.   Review of Systems Review of Systems  All other systems reviewed and are  negative.    Physical Exam Updated Vital Signs BP 132/76   Pulse 67   Temp 97.9 F (36.6 C) (Oral)   Resp 16   SpO2 98%   Physical Exam  Constitutional: He appears well-developed and well-nourished. No distress.  HENT:  Head: Normocephalic and atraumatic.  Right Ear: External ear normal.  Left Ear: External ear normal.  Mouth/Throat: Dental caries present.  No submandibular swelling, no oral pharyngeal swelling, dental caries with obvious decay in the left posterior molars  Eyes: Conjunctivae are normal. Right eye exhibits no discharge. Left eye exhibits no discharge. No scleral icterus.  Neck: Neck supple. No tracheal deviation present.  Cardiovascular: Normal rate.  Pulmonary/Chest: Effort normal. No stridor. No respiratory distress.  Abdominal: He exhibits no distension.  Musculoskeletal: He exhibits no edema.  Neurological: He is alert. Cranial nerve deficit: no gross deficits.  Skin: Skin is warm and dry. No rash noted.  Psychiatric: He has a normal mood and affect.  Nursing note and vitals reviewed.    ED Treatments / Results  LProcedures Procedures (including critical care time)  Medications Ordered in ED Medications  penicillin v potassium (VEETID) tablet 500 mg (not administered)  naproxen (NAPROSYN) tablet 500 mg (not administered)     Initial Impression / Assessment and Plan / ED Course  I have reviewed the triage vital signs and the nursing notes.  Pertinent labs & imaging results that  were available during my care of the patient were reviewed by me and considered in my medical decision making (see chart for details).    Symptoms are consistent with a tooth infection. There are no signs of an abscess. He has no significant facial swelling. At this time there does not appear to be any evidence of an emergency medical condition. . Patient will be given prescriptions  for pain medications and antibiotics.    Final Clinical Impressions(s) / ED Diagnoses    Final diagnoses:  Dental caries    ED Discharge Orders        Ordered    naproxen (NAPROSYN) 500 MG tablet  2 times daily     03/11/17 2339    penicillin v potassium (VEETID) 500 MG tablet  4 times daily     03/11/17 2339       Linwood DibblesKnapp, Albertus Chiarelli, MD 03/11/17 2342

## 2017-03-11 NOTE — ED Triage Notes (Signed)
Pt states he woke up with some left sided facial swelling. C/o dental pain in the left and right side of his mouth for about 3-4 weeks. No fevers. No meds PTA.

## 2017-03-11 NOTE — Discharge Instructions (Signed)
Follow-up with a dentist as soon as possible, take medications as prescribed

## 2018-09-30 IMAGING — CR DG ANKLE COMPLETE 3+V*L*
3 series · 3 of 3 positions shown · non-contrast
Comparison: None.

CLINICAL DATA: Twisted ankle 1 week ago. Persistent pain and
swelling.

EXAM:
LEFT ANKLE COMPLETE - 3+ VIEW

[x ankle lat left]
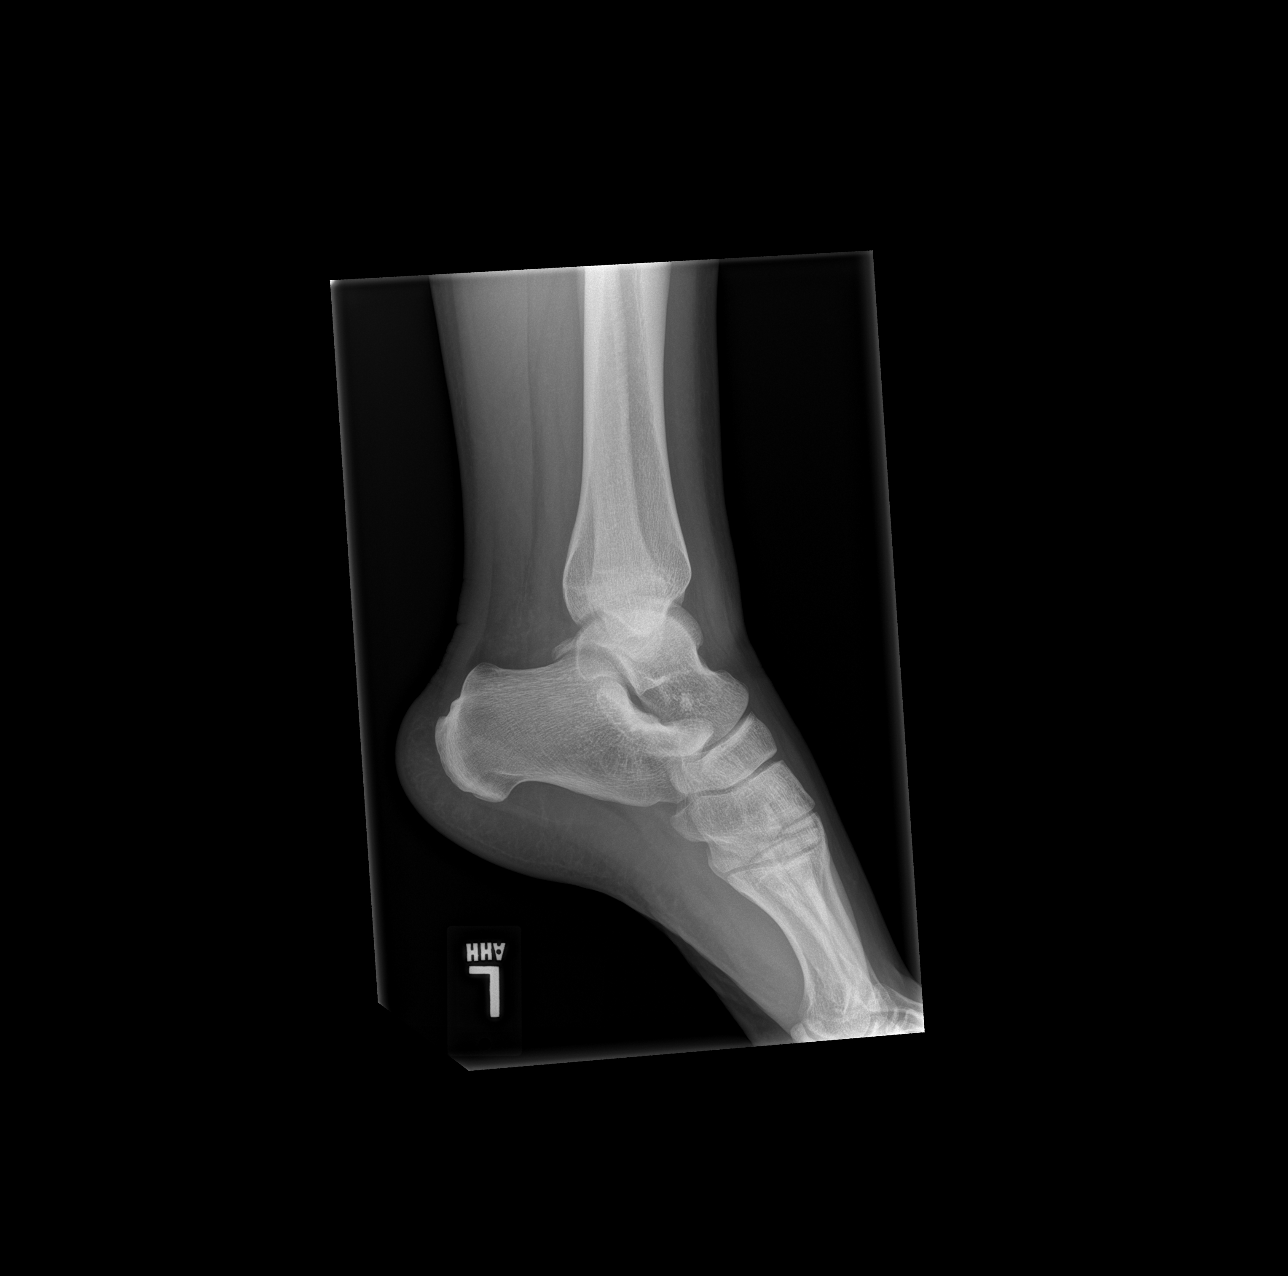

[x ankle ap left]
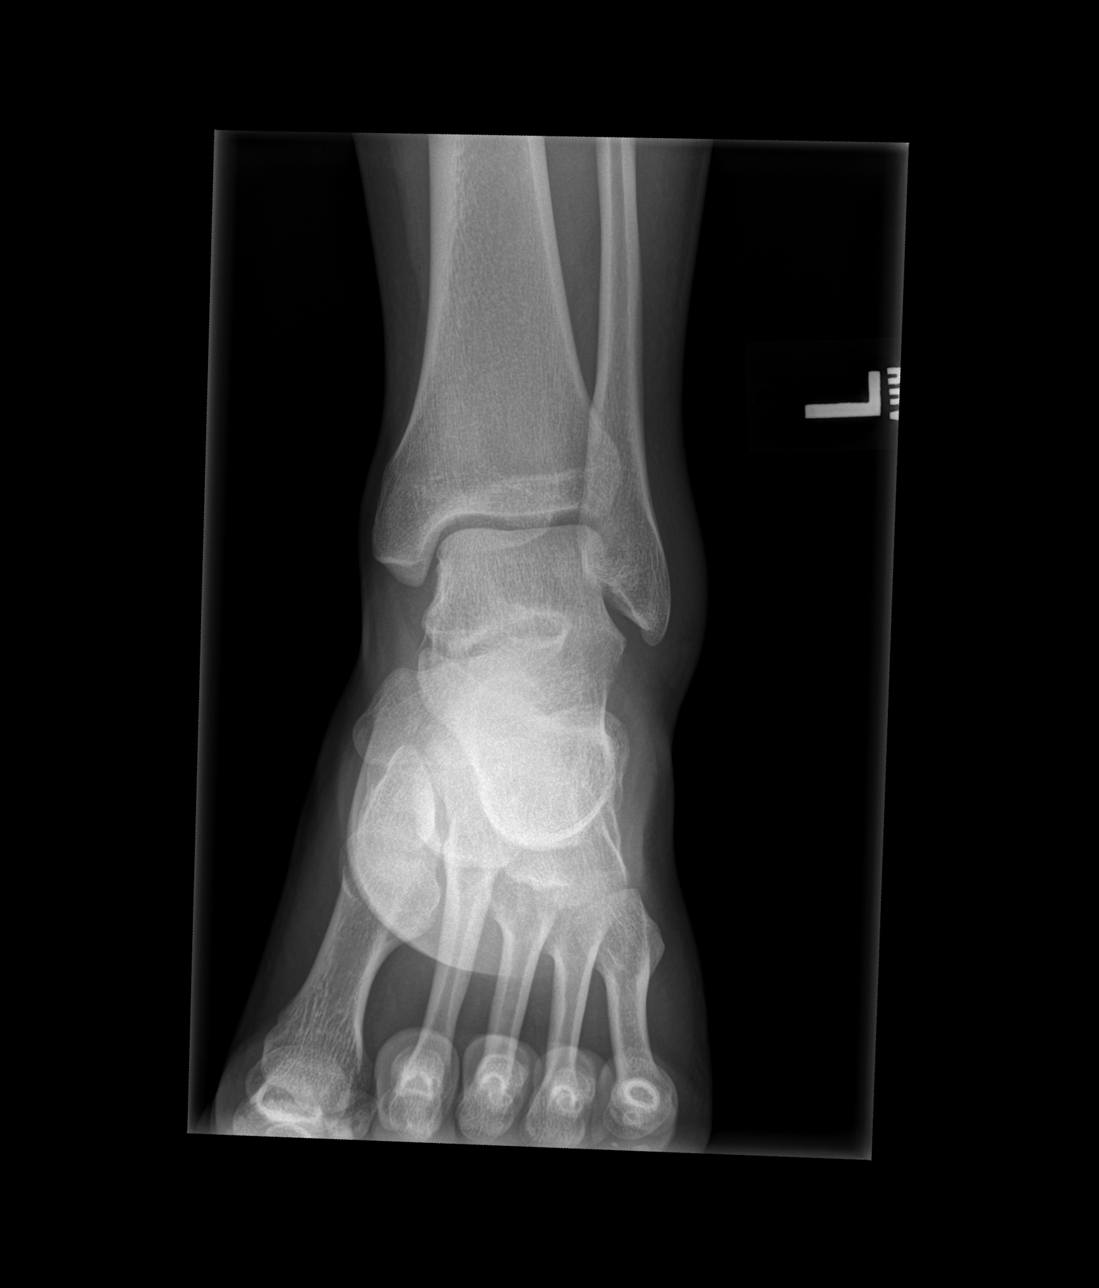

[x ankle obl left]
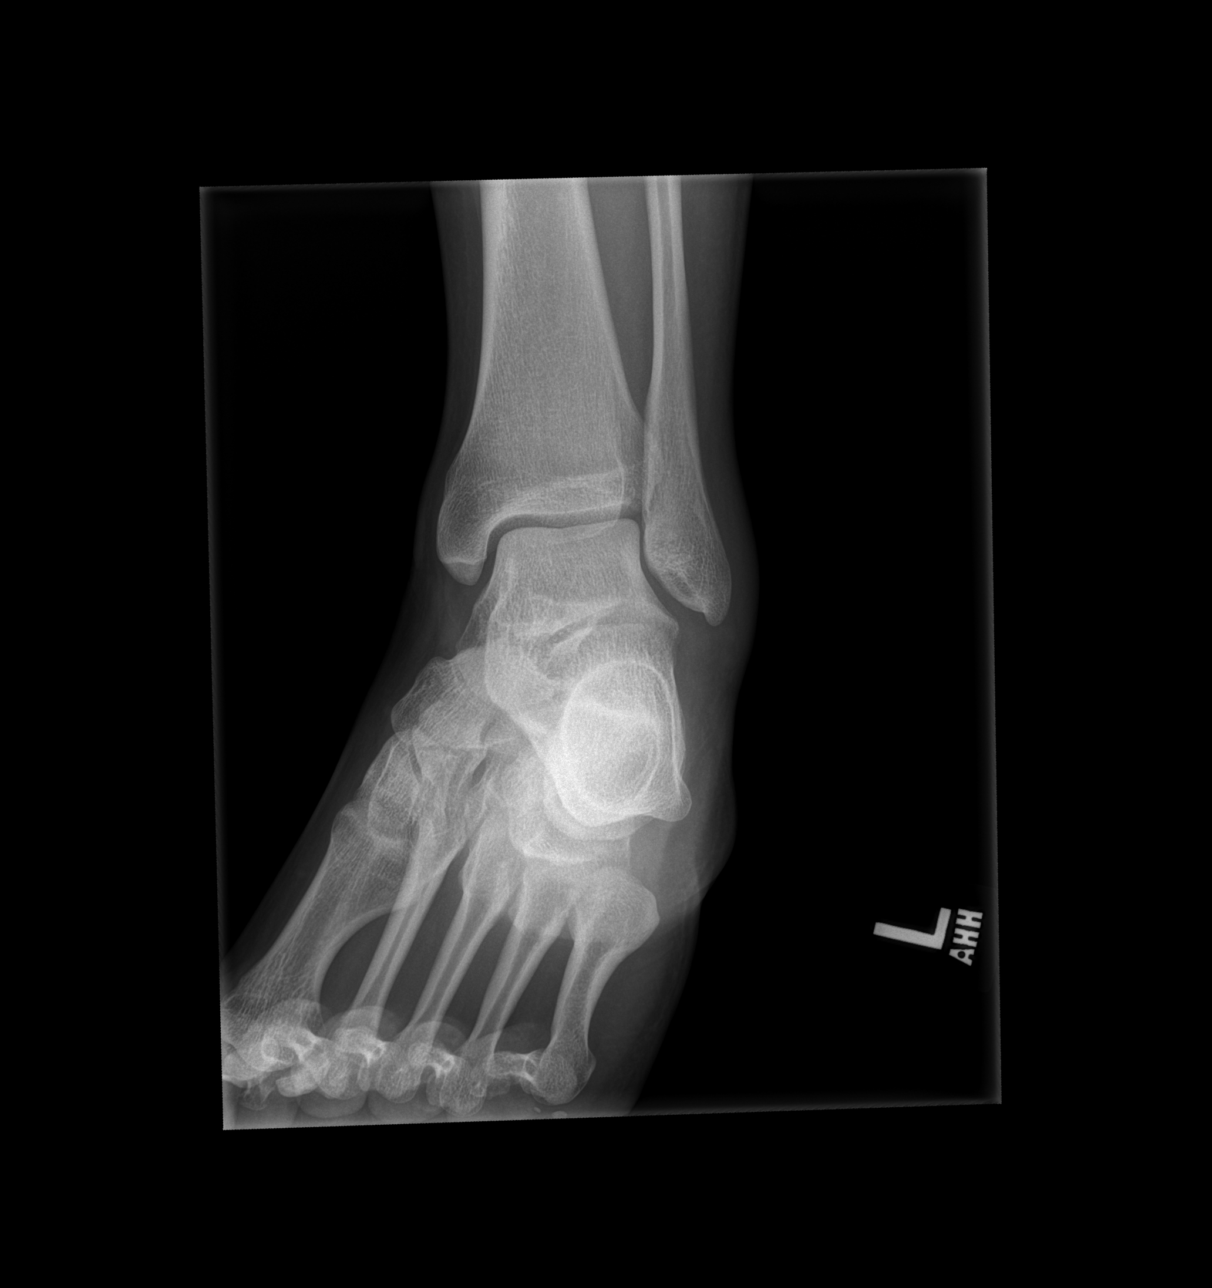

[3 of 3 positions shown; findings below may reference images not displayed]

FINDINGS: There is no evidence of fracture, dislocation, or joint effusion.
There is no evidence of arthropathy or other focal bone abnormality.
Soft tissues are unremarkable.
IMPRESSION: Negative.

## 2018-09-30 IMAGING — CR DG FOOT COMPLETE 3+V*L*
3 series · 3 of 3 positions shown · non-contrast
Comparison: None.

CLINICAL DATA: Injury with persistent pain.

EXAM:
LEFT FOOT - COMPLETE 3+ VIEW

[x foot ap left]
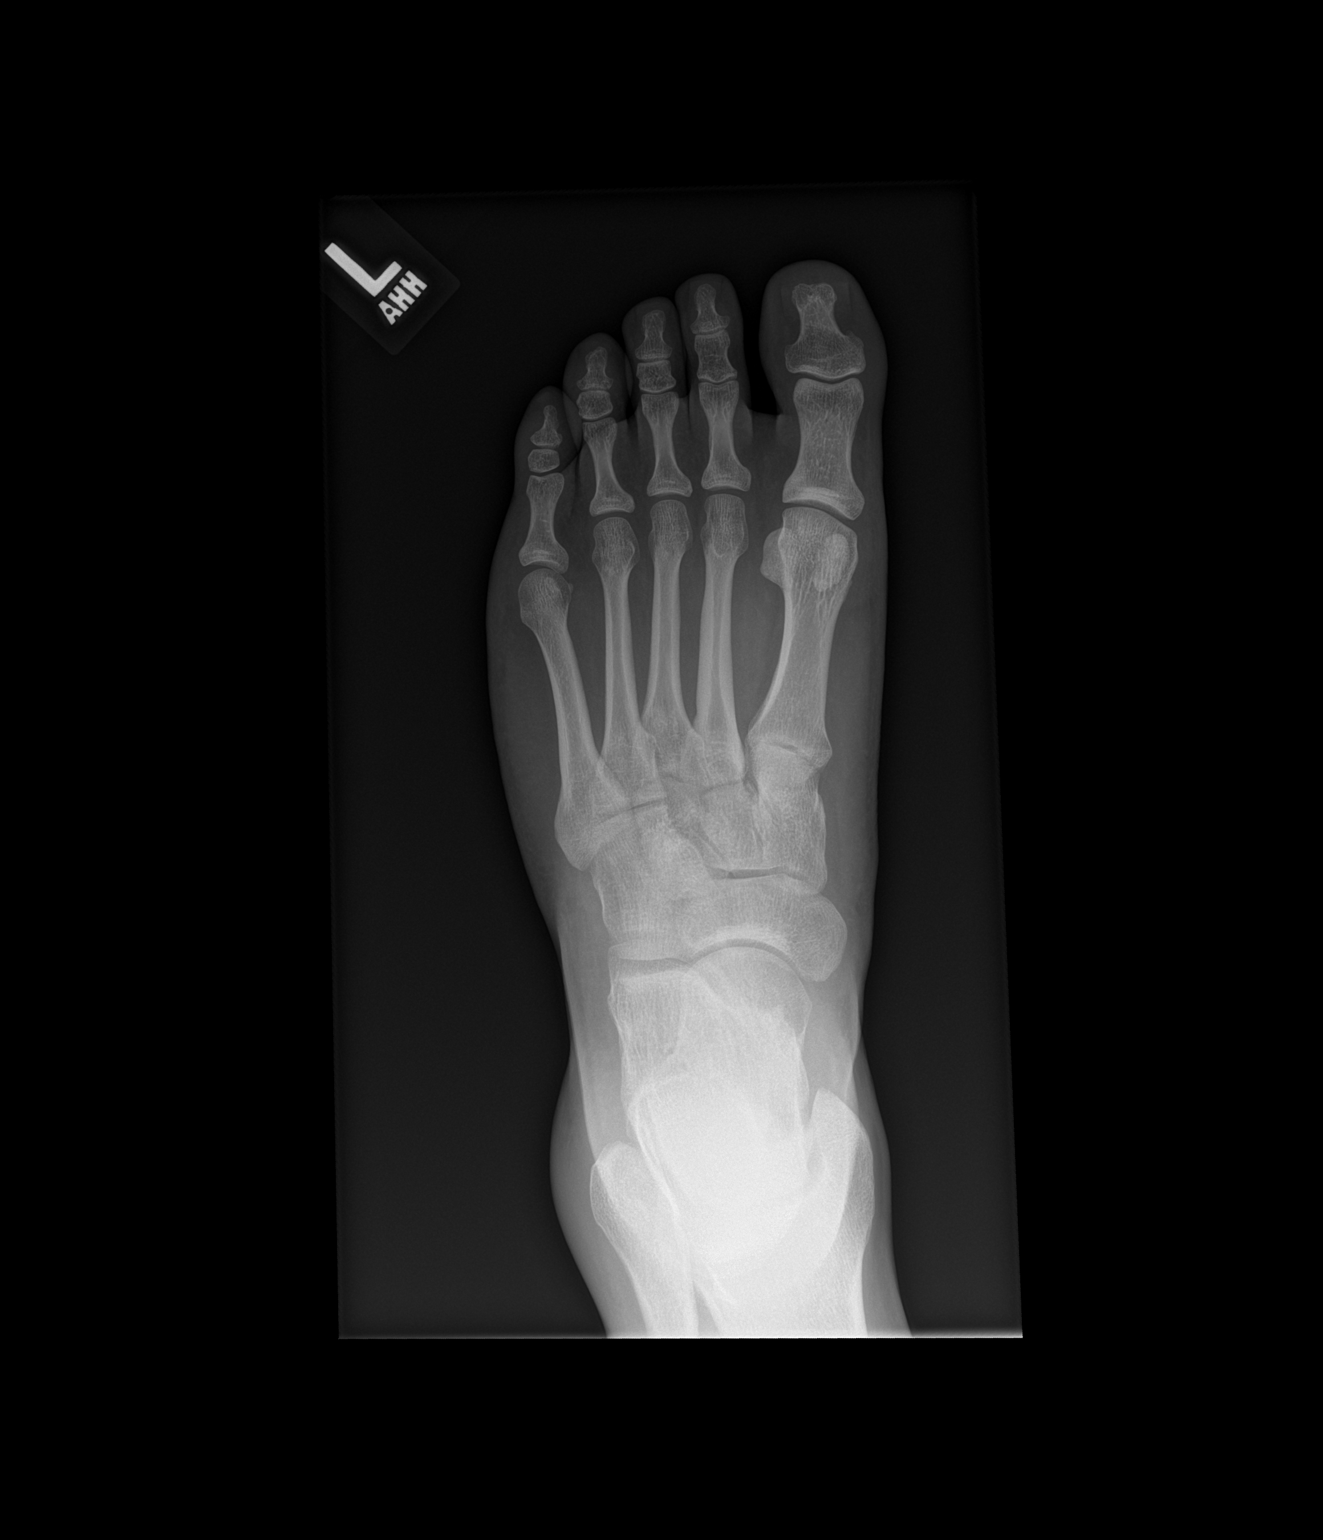

[x foot obl left]
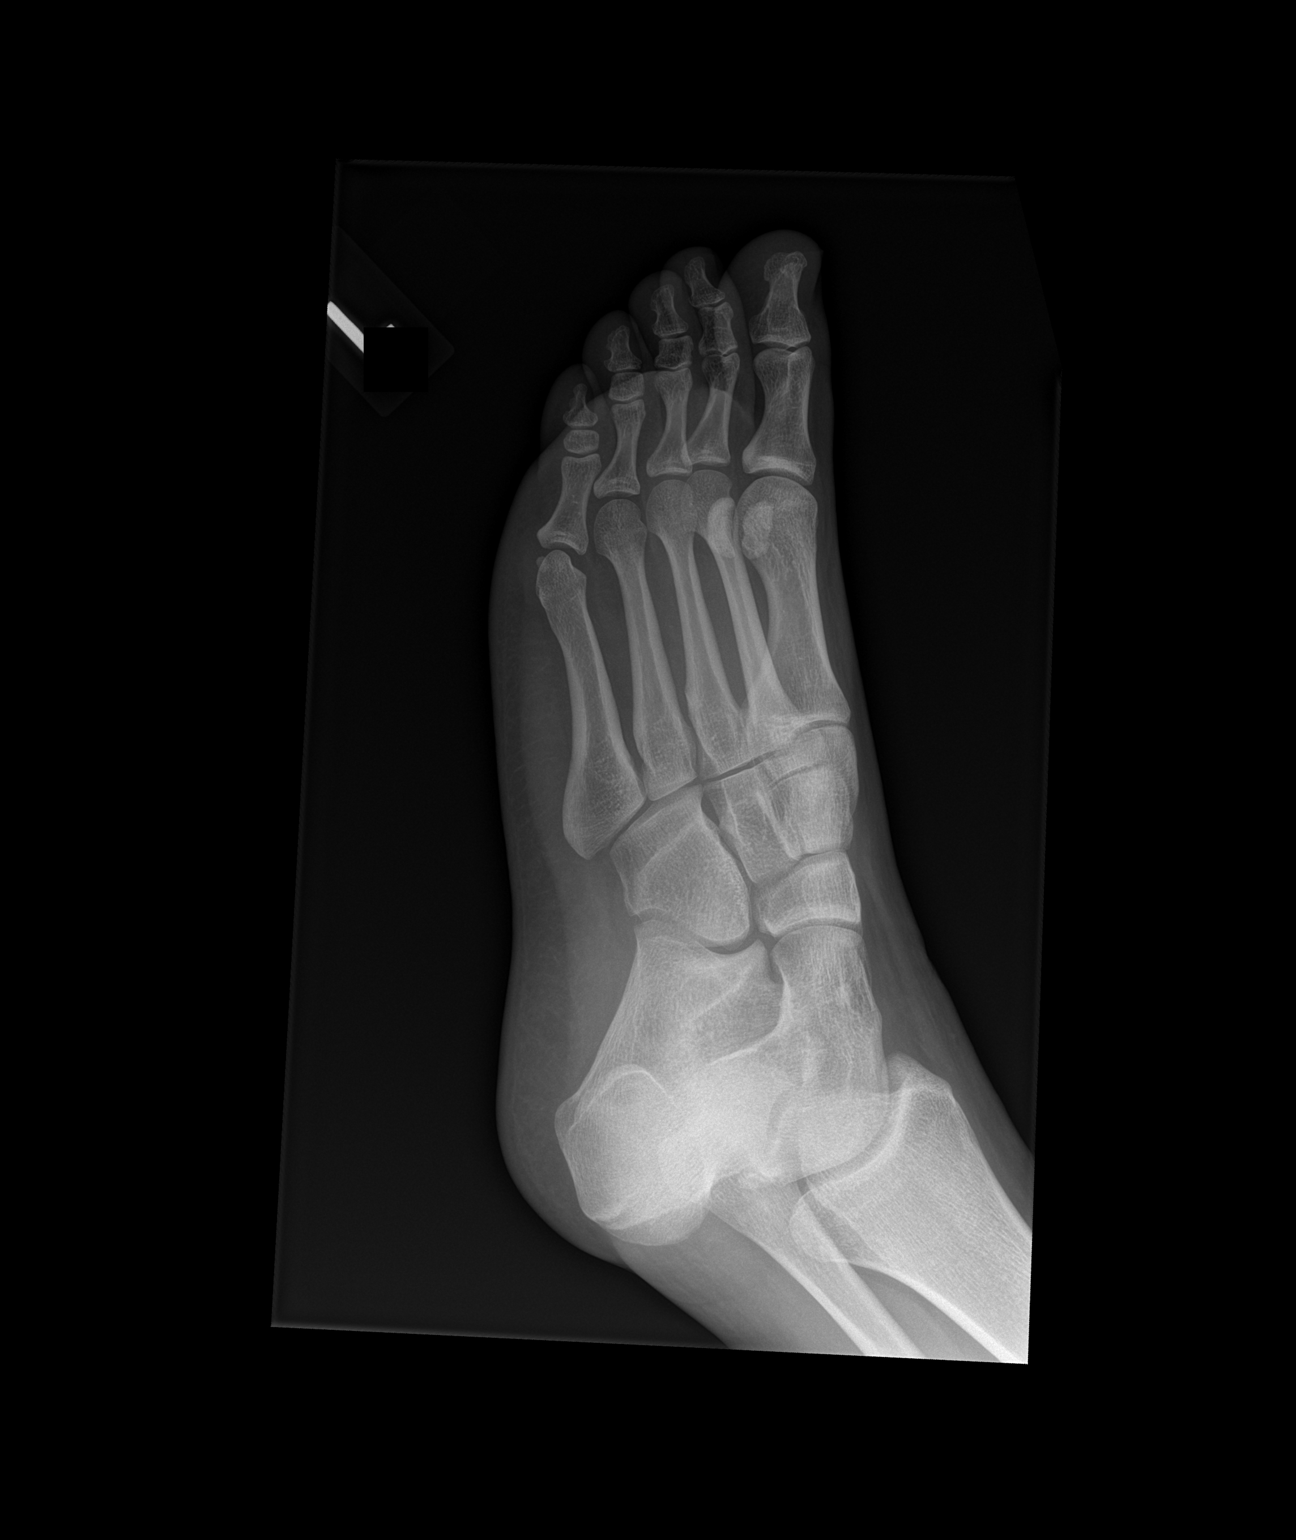

[x foot lat left]
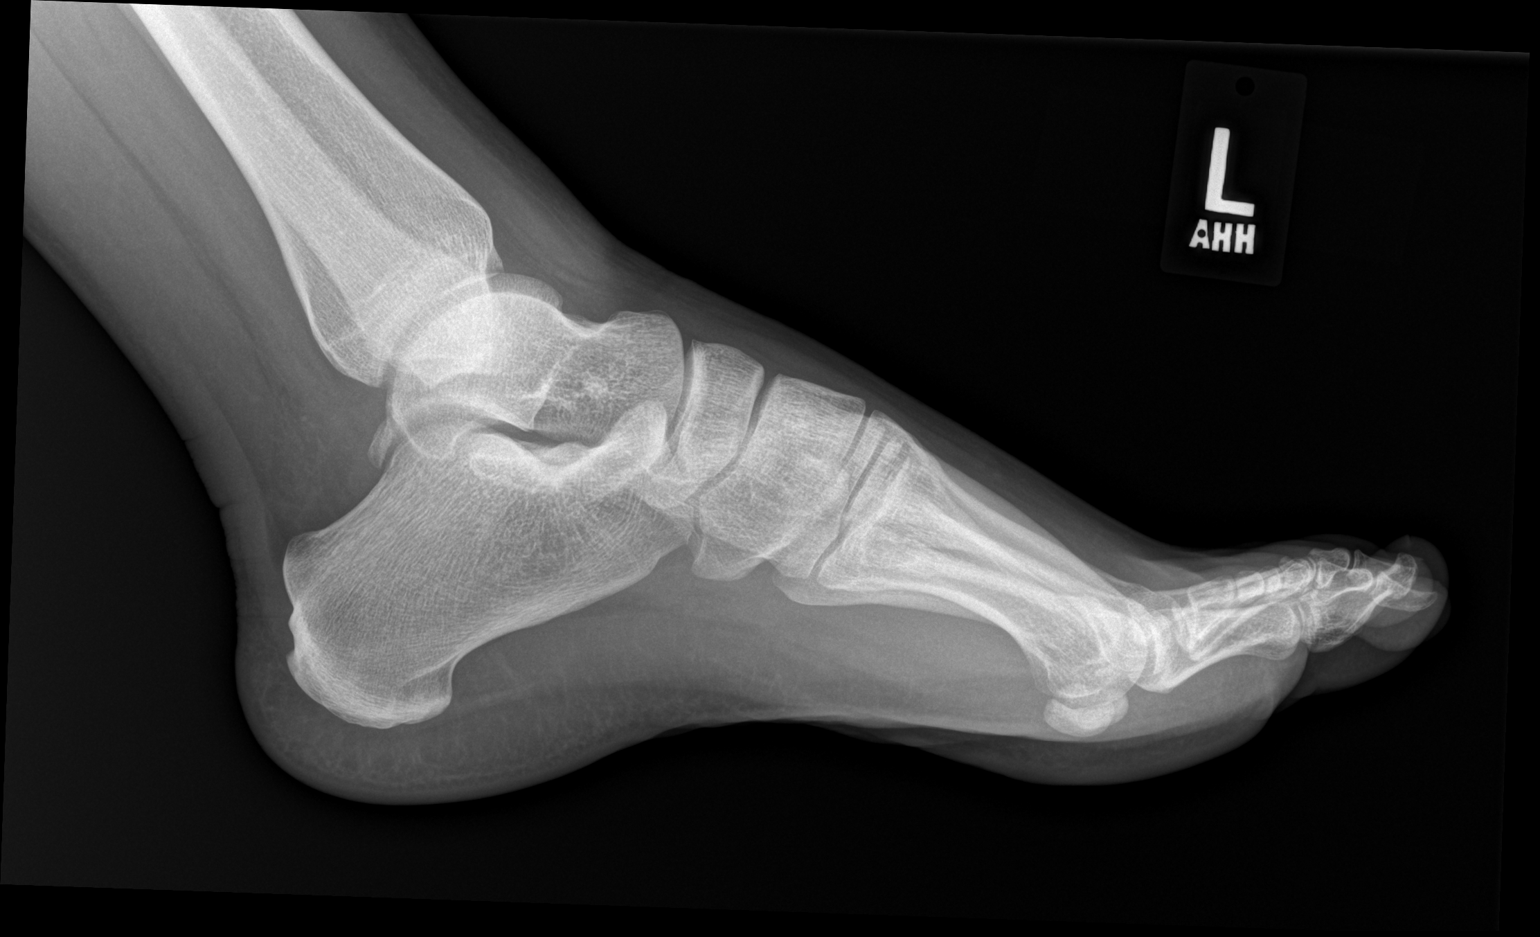

[3 of 3 positions shown; findings below may reference images not displayed]

FINDINGS: There is no evidence of fracture or dislocation. There is no
evidence of arthropathy or other focal bone abnormality. Soft
tissues are unremarkable.
IMPRESSION: Negative.
